# Patient Record
Sex: Female | Born: 1955 | Race: Black or African American | Hispanic: No | Marital: Single | Smoking: Never smoker
Health system: Southern US, Community
[De-identification: ages and names within clinical notes are randomized; demographics above are authoritative.]

## PROBLEM LIST (undated history)

## (undated) HISTORY — PX: GASTRIC BYPASS: SHX52

## (undated) HISTORY — PX: ABDOMINAL HYSTERECTOMY: SHX81

---

## 2020-09-04 ENCOUNTER — Encounter (HOSPITAL_BASED_OUTPATIENT_CLINIC_OR_DEPARTMENT_OTHER): Payer: Self-pay | Admitting: *Deleted

## 2020-09-04 ENCOUNTER — Emergency Department (HOSPITAL_BASED_OUTPATIENT_CLINIC_OR_DEPARTMENT_OTHER)
Admission: EM | Admit: 2020-09-04 | Discharge: 2020-09-04 | Disposition: A | Discharge status: Home / Self Care | Payer: Medicare Other | Attending: Emergency Medicine | Admitting: Emergency Medicine

## 2020-09-04 ENCOUNTER — Other Ambulatory Visit: Payer: Self-pay

## 2020-09-04 ENCOUNTER — Emergency Department (HOSPITAL_BASED_OUTPATIENT_CLINIC_OR_DEPARTMENT_OTHER): Payer: Medicare Other

## 2020-09-04 DIAGNOSIS — R000 Tachycardia, unspecified: Secondary | ICD-10-CM | POA: Insufficient documentation

## 2020-09-04 DIAGNOSIS — E041 Nontoxic single thyroid nodule: Secondary | ICD-10-CM | POA: Diagnosis not present

## 2020-09-04 DIAGNOSIS — R0989 Other specified symptoms and signs involving the circulatory and respiratory systems: Secondary | ICD-10-CM | POA: Insufficient documentation

## 2020-09-04 DIAGNOSIS — R0789 Other chest pain: Secondary | ICD-10-CM | POA: Insufficient documentation

## 2020-09-04 DIAGNOSIS — M25511 Pain in right shoulder: Secondary | ICD-10-CM | POA: Diagnosis not present

## 2020-09-04 DIAGNOSIS — Z20822 Contact with and (suspected) exposure to covid-19: Secondary | ICD-10-CM | POA: Diagnosis not present

## 2020-09-04 DIAGNOSIS — R937 Abnormal findings on diagnostic imaging of other parts of musculoskeletal system: Secondary | ICD-10-CM

## 2020-09-04 DIAGNOSIS — R072 Precordial pain: Secondary | ICD-10-CM | POA: Diagnosis present

## 2020-09-04 DIAGNOSIS — R918 Other nonspecific abnormal finding of lung field: Secondary | ICD-10-CM

## 2020-09-04 LAB — CBC
HCT: 40.7 % (ref 36.0–46.0)
Hemoglobin: 13.6 g/dL (ref 12.0–15.0)
MCH: 29.6 pg (ref 26.0–34.0)
MCHC: 33.4 g/dL (ref 30.0–36.0)
MCV: 88.7 fL (ref 80.0–100.0)
Platelets: 200 10*3/uL (ref 150–400)
RBC: 4.59 MIL/uL (ref 3.87–5.11)
RDW: 12.6 % (ref 11.5–15.5)
WBC: 13.1 10*3/uL — ABNORMAL HIGH (ref 4.0–10.5)
nRBC: 0 % (ref 0.0–0.2)

## 2020-09-04 LAB — BASIC METABOLIC PANEL
Anion gap: 12 (ref 5–15)
BUN: 9 mg/dL (ref 8–23)
CO2: 24 mmol/L (ref 22–32)
Calcium: 9.4 mg/dL (ref 8.9–10.3)
Chloride: 99 mmol/L (ref 98–111)
Creatinine, Ser: 0.57 mg/dL (ref 0.44–1.00)
GFR, Estimated: >60 mL/min (ref >60)
Glucose, Bld: 178 mg/dL — ABNORMAL HIGH (ref 70–99)
Potassium: 4.1 mmol/L (ref 3.5–5.1)
Sodium: 135 mmol/L (ref 135–145)

## 2020-09-04 LAB — HEPATIC FUNCTION PANEL
ALT: 69 U/L — ABNORMAL HIGH (ref 0–44)
AST: 107 U/L — ABNORMAL HIGH (ref 15–41)
Albumin: 4.1 g/dL (ref 3.5–5.0)
Alkaline Phosphatase: 103 U/L (ref 38–126)
Bilirubin, Direct: 0.2 mg/dL (ref 0.0–0.2)
Indirect Bilirubin: 0.1 mg/dL — ABNORMAL LOW (ref 0.3–0.9)
Total Bilirubin: 0.3 mg/dL (ref 0.3–1.2)
Total Protein: 8.3 g/dL — ABNORMAL HIGH (ref 6.5–8.1)

## 2020-09-04 LAB — LIPASE, BLOOD: Lipase: 19 U/L (ref 11–51)

## 2020-09-04 LAB — RESP PANEL BY RT-PCR (FLU A&B, COVID) ARPGX2
Influenza A by PCR: NEGATIVE
Influenza B by PCR: NEGATIVE
SARS Coronavirus 2 by RT PCR: NEGATIVE

## 2020-09-04 LAB — TROPONIN I (HIGH SENSITIVITY): Troponin I (High Sensitivity): 4 ng/L (ref <18)

## 2020-09-04 MED ORDER — DOXYCYCLINE HYCLATE 100 MG PO CAPS: 100.0000 mg | ORAL_CAPSULE | Freq: Two times a day (BID) | ORAL | 0 refills | Status: AC

## 2020-09-04 MED ORDER — IOHEXOL 350 MG/ML SOLN
100.0000 mL | Freq: Once | INTRAVENOUS | Status: AC | PRN
  Administered 2020-09-04: 100 mL via INTRAVENOUS

## 2020-09-04 MED ORDER — IBUPROFEN 100 MG/5ML PO SUSP
800.0000 mg | Freq: Once | ORAL | Status: AC
  Administered 2020-09-04: 800 mg via ORAL
  Filled 2020-09-04: qty 40

## 2020-09-04 NOTE — ED Notes (Signed)
Pt stating she wants to leave. This RN informed pt that we would like her to stay to receive CT results as if something is abnormal she would need to be admitted. Pt stating she doesn't understand why it is taking so long to get results and that this is a "rip off". This RN explained process and reasoning behind scans that were ordered. Requested to remove one of her IV's. Provider notified of patients desire to leave. Pt agrees to stay to receive results.

## 2020-09-04 NOTE — ED Notes (Signed)
Patient to ct scan.

## 2020-09-04 NOTE — ED Triage Notes (Signed)
Epigastric pain with right scapula pain x 5 days. Pain started after lifting. EKG done at triage.

## 2020-09-04 NOTE — Discharge Instructions (Addendum)
Your abnormal CT findings were discussed, recommend follow up with your doctor regarding the abnormal bone findings discussed. Thyroid nodule- follow up with PCP, may consider [place]. Ground glass opacities in lungs are often consistent with COVID. If your COVID test is positive, wear a mask for the remaining of 10 days.  If your COVID test is negative- a prescription for antibiotics can be sent to your pharmacy on file [facility] outpatient pharmacy) for atypical pneumonia.   Return as needed.

## 2020-09-04 NOTE — ED Provider Notes (Signed)
[facility] EMERGENCY DEPARTMENT Provider Note   CSN: [id number] Arrival date & time: 09/04/20  1303     History Chief Complaint  Patient presents with   Chest Pain    Angie Donaldson is a 65 y.o. female.  65 year old female presents with complaint of pain in her chest and deep to her right scapula.  States her pain started 5 to 6 days ago, she has been doing a lot of heavy lifting and moving her catheter box recently, does not typically do this sort of activity.  States that her pain in her midsternal area is burning to aching in nature, both pains improve with ibuprofen and return of symptoms the ibuprofen wears off.  Denies exertional pain, shortness of breath, diaphoresis, nausea.  Denies history of hypertension, hyperlipidemia, diabetes, is a non-smoker, no personal or family cardiac history, no personal or family history of PE or DVT.      History reviewed. No pertinent past medical history.  There are no problems to display for this patient.   Past Surgical History:  Procedure Laterality Date   ABDOMINAL HYSTERECTOMY     GASTRIC BYPASS       OB History   No obstetric history on file.     No family history on file.  Social History   Tobacco Use   Smoking status: Never   Smokeless tobacco: Never  Vaping Use   Vaping Use: Never used  Substance Use Topics   Alcohol use: Yes   Drug use: Never    Home Medications Prior to Admission medications   Not on File    Allergies    Sulfa antibiotics  Review of Systems   Review of Systems  Constitutional:  Negative for chills, diaphoresis and fever.  Respiratory:  Negative for cough and shortness of breath.   Cardiovascular:  Positive for chest pain. Negative for palpitations and leg swelling.  Gastrointestinal:  Negative for abdominal pain, constipation, diarrhea, nausea and vomiting.  Genitourinary:  Negative for dysuria.  Musculoskeletal:  Positive for back pain. Negative for arthralgias and  myalgias.  Skin:  Negative for rash and wound.  Allergic/Immunologic: Negative for immunocompromised state.  Neurological:  Negative for dizziness and weakness.  Hematological:  Negative for adenopathy. Does not bruise/bleed easily.  Psychiatric/Behavioral:  Negative for confusion.   All other systems reviewed and are negative.  Physical Exam Updated Vital Signs BP (!) 141/97   Pulse (!) 106   Temp 98.2 F (36.8 C) (Oral)   Resp 20   Ht 5\' 7"  (1.702 m)   Wt 57.6 kg   SpO2 100%   BMI 19.89 kg/m   Physical Exam Vitals and nursing note reviewed.  Constitutional:      General: She is not in acute distress.    Appearance: She is well-developed. She is not diaphoretic.  HENT:     Head: Normocephalic and atraumatic.  Cardiovascular:     Rate and Rhythm: Regular rhythm. Tachycardia present.     Heart sounds: Normal heart sounds.  Pulmonary:     Effort: Pulmonary effort is normal.     Breath sounds: Normal breath sounds.  Chest:     Chest wall: No tenderness.  Abdominal:     Palpations: Abdomen is soft.     Tenderness: There is no abdominal tenderness.  Musculoskeletal:     Right lower leg: No tenderness. No edema.     Left lower leg: No tenderness. No edema.  Skin:    General: Skin is warm and  dry.     Findings: No erythema or rash.  Neurological:     Mental Status: She is alert and oriented to person, place, and time.  Psychiatric:        Behavior: Behavior normal.    ED Results / Procedures / Treatments   Labs (all labs ordered are listed, but only abnormal results are displayed) Labs Reviewed  BASIC METABOLIC PANEL - Abnormal; Notable for the following components:      Result Value   Glucose, Bld 178 (*)    All other components within normal limits  CBC - Abnormal; Notable for the following components:   WBC 13.1 (*)    All other components within normal limits  RESP PANEL BY RT-PCR (FLU A&B, COVID) [id number]  HEPATIC FUNCTION PANEL  LIPASE, BLOOD  TROPONIN I  (HIGH SENSITIVITY)    EKG EKG Interpretation  Date/Time:  Thursday September 04 2020 13:16:34 EDT Ventricular Rate:  113 PR Interval:  148 QRS Duration: 74 QT Interval:  338 QTC Calculation: 463 R Axis:   75 Text Interpretation: Sinus tachycardia Cannot rule out Anterior infarct , age undetermined No previous tracing Confirmed by Gwyneth Sprout ([id number]) on 09/04/2020 1:56:05 PM  Radiology CT Angio Chest PE W/Cm &/Or Wo Cm  Result Date: 09/04/2020 CLINICAL DATA:  Epigastric pain, right scapular pain for 5 days, lifting injury EXAM: CT ANGIOGRAPHY CHEST WITH CONTRAST TECHNIQUE: Multidetector CT imaging of the chest was performed using the standard protocol during bolus administration of intravenous contrast. Multiplanar CT image reconstructions and MIPs were obtained to evaluate the vascular anatomy. CONTRAST:  OMNIPAQUE IOHEXOL 350 MG/ML SOLN COMPARISON:  09/04/2020 FINDINGS: Cardiovascular: This is a technically adequate evaluation of the pulmonary vasculature. No filling defects or pulmonary emboli. The heart is unremarkable without pericardial effusion. No evidence of thoracic aortic aneurysm or dissection. Minimal atherosclerosis. Mediastinum/Nodes: There is a 9 mm densely calcified nodule in the right lobe of the thyroid. Trachea and esophagus are unremarkable. No pathologic adenopathy. Lungs/Pleura: There is mild upper lobe predominant emphysema. Patchy nodular ground-glass airspace disease is seen within the right upper and left upper lobes, likely inflammatory or infectious. No dense consolidation, effusion, or pneumothorax. Central airways are patent. Upper Abdomen: Postsurgical changes are seen from previous bariatric surgery. Diffuse hepatic steatosis is identified. There is abnormal appearance of the visualized portions of the pancreatic body, with peripancreatic fat stranding and small subcentimeter lymph nodes. CT or MRI of the abdomen to include the pancreas is recommended for  complete characterization. Musculoskeletal: There are nonspecific sclerotic foci within the T12 vertebral body, T3 spinous process, and sternum. No acute displaced fractures. Reconstructed images demonstrate no additional findings. Review of the MIP images confirms the above findings. IMPRESSION: 1. No evidence of pulmonary embolus. 2. Patchy ground-glass opacities within the right upper and left upper lobes, consistent with inflammatory or infectious etiology. 3. Abnormal appearance of the visualized portions of the pancreatic body, with peripancreatic inflammatory changes. Pancreatitis could give this appearance, and correlation with laboratory values is recommended. CT or MRI of the pancreas could be performed to completely assess these findings. 4. Nonspecific sclerotic foci within the sternum and thoracic spine as above. If further evaluation is desired, nonemergent follow-up bone scan could be considered. 5. Aortic Atherosclerosis (ICD10-I70.0) and Emphysema (ICD10-J43.9). 6. Hepatic steatosis. Electronically Signed   By: Sharlet Salina M.D.   On: 09/04/2020 15:28   DG Chest Port 1 View  Result Date: 09/04/2020 CLINICAL DATA:  Chest pain EXAM: PORTABLE CHEST 1 VIEW COMPARISON:  None. FINDINGS: The heart size and mediastinal contours are within normal limits. Atherosclerotic calcification of the aortic knob. No focal airspace consolidation, pleural effusion, or pneumothorax. Degenerative changes of the bilateral shoulders. IMPRESSION: No active disease. Electronically Signed   By: Duanne Guess D.O.   On: 09/04/2020 14:16    Procedures Procedures   Medications Ordered in ED Medications  iohexol (OMNIPAQUE) 350 MG/ML injection 100 mL (100 mLs Intravenous Contrast Given 09/04/20 1450)  ibuprofen (ADVIL) 100 MG/5ML suspension 800 mg (800 mg Oral Given 09/04/20 1559)    ED Course  I have reviewed the triage vital signs and the nursing notes.  Pertinent labs & imaging results that were available  during my care of the patient were reviewed by me and considered in my medical decision making (see chart for details).  Clinical Course as of 09/04/20 1756  Thu Sep 04, 2020  1160 65 year old female presents with complaint of pain to her midsternal and right scapula for the past 6 days.  No associated shortness of breath, no exertional pain, pain resolved with ibuprofen. Patient is tachycardic, concern for PE, no ischemic changes on EKG, troponin 4 and this patient has had pain for the past 6 days.  CT angio chest with several incidental findings which were discussed with the patient, also discussed from last opacities to left upper and right upper lobes.  We will add on COVID test, if COVID test positive will finish out quarantine.  Otherwise if COVID is negative we will send antibiotics to patient's pharmacy.  Patient reports history of pancreatitis, does not feel similar to her prior pancreatitis flares.  She states that she would like to be discharged at this time.  Patient agrees to follow-up with her primary care provider regarding her CT findings today. [LM]    Clinical Course User Index [LM] Alden Hipp   MDM Rules/Calculators/A&P                           Final Clinical Impression(s) / ED Diagnoses Final diagnoses:  Atypical chest pain  Abnormal bone radiograph  Thyroid nodule  Ground glass opacity present on imaging of lung    Rx / DC Orders ED Discharge Orders     None        Jeannie Fend, PA-C 09/04/20 1756    Gwyneth Sprout, MD 09/05/20 1513

## 2022-06-07 IMAGING — CT CT ANGIO CHEST
2 of 8 series · 18 of 36 positions shown · IV contrast (Omnipaque)
Comparison: 09/04/2020

CLINICAL DATA: Epigastric pain, right scapular pain for 5 days,
lifting injury

EXAM:
CT ANGIOGRAPHY CHEST WITH CONTRAST
TECHNIQUE: Multidetector CT imaging of the chest was performed using the
standard protocol during bolus administration of intravenous
contrast. Multiplanar CT image reconstructions and MIPs were
obtained to evaluate the vascular anatomy.
CONTRAST:  100mL OMNIPAQUE IOHEXOL 350 MG/ML SOLN

[Series 6: pe coronal mpr · coronal · 0.56mm/px · 1 of 118 slices shown]
[im 59/118  mediastinal]
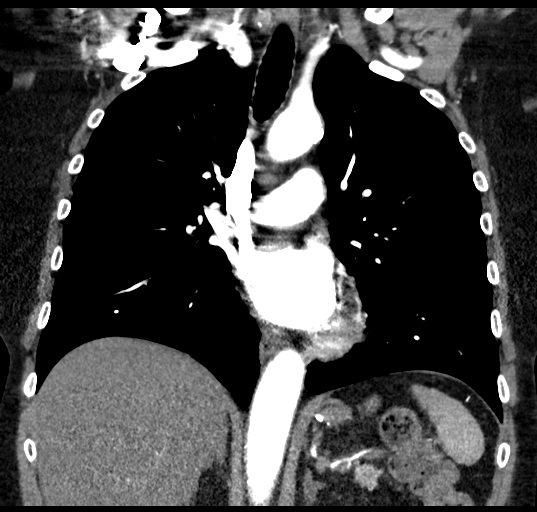

[Series 10: pe thins · axial · 0.64mm/px · z∈[-261,-11]mm · 17 of 280 slices shown]
[im 15/280  lung]
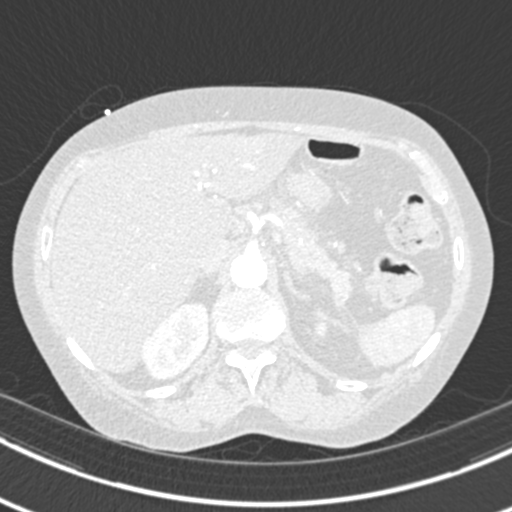
[im 30/280  mediastinal]
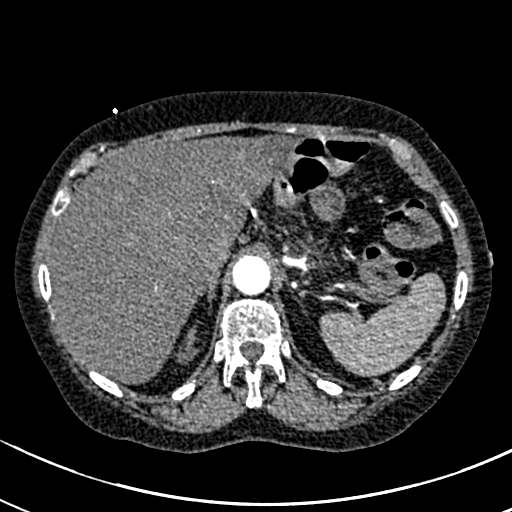
[im 45/280  lung]
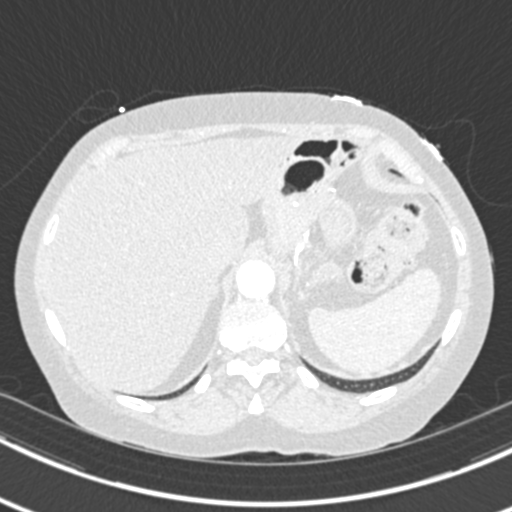
[im 59/280  mediastinal]
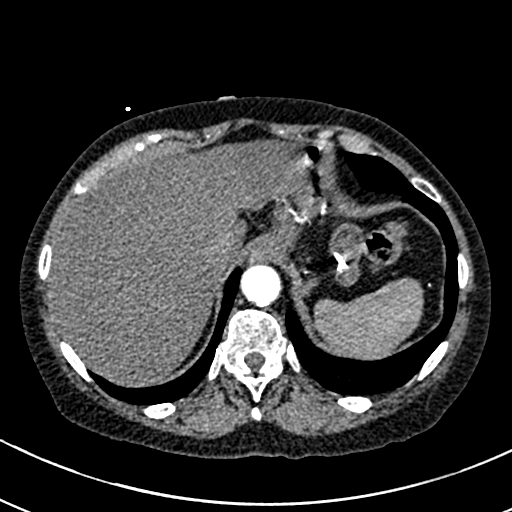
[im 74/280  lung]
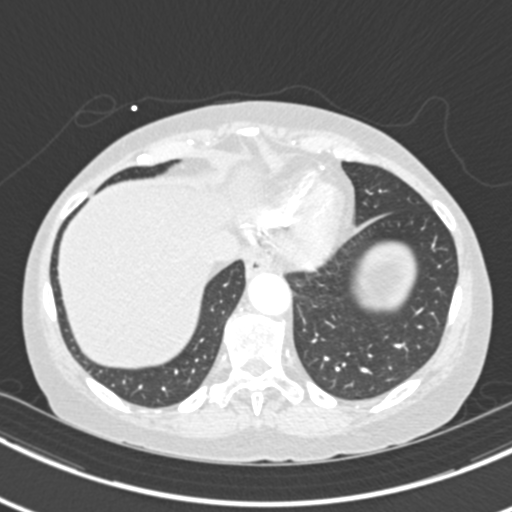
[im 89/280  mediastinal]
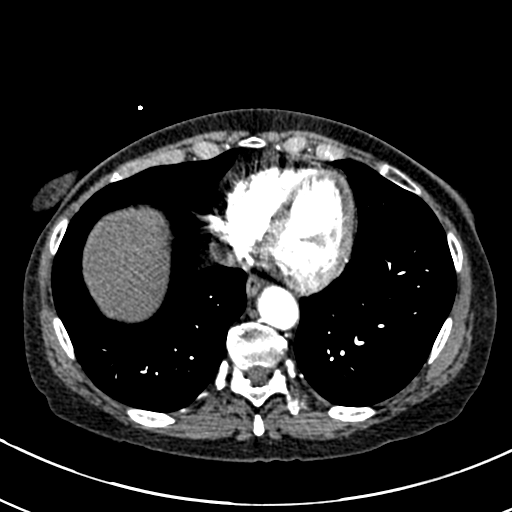
[im 103/280  lung]
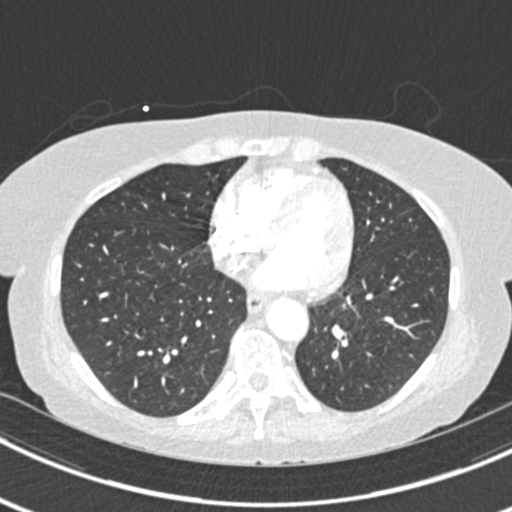
[im 118/280  mediastinal]
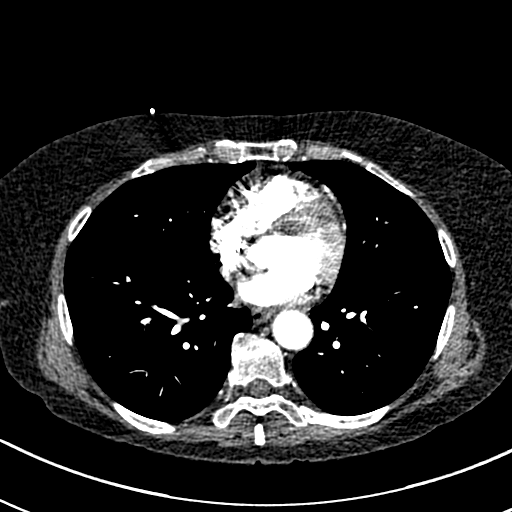
[im 147/280  lung]
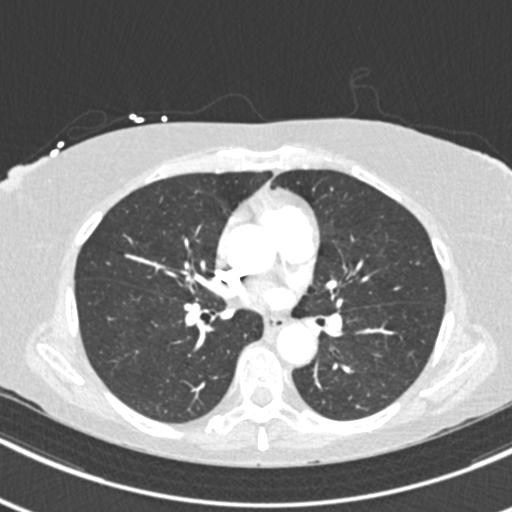
[im 162/280  mediastinal]
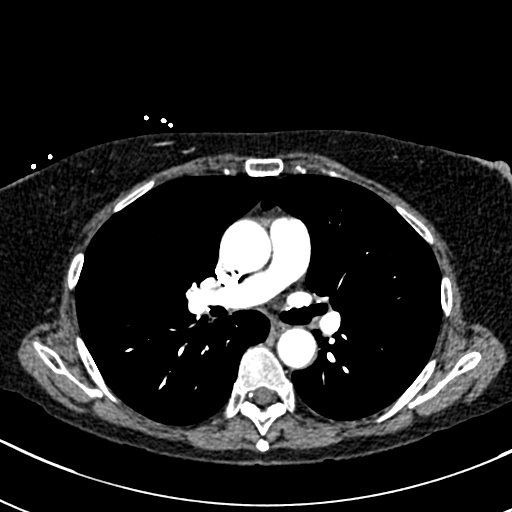
[im 177/280  lung]
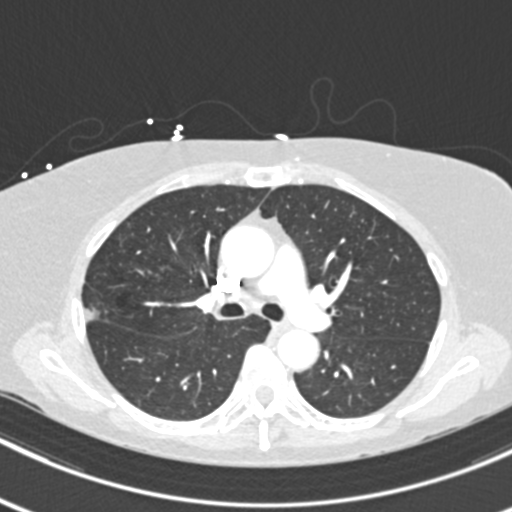
[im 191/280  mediastinal]
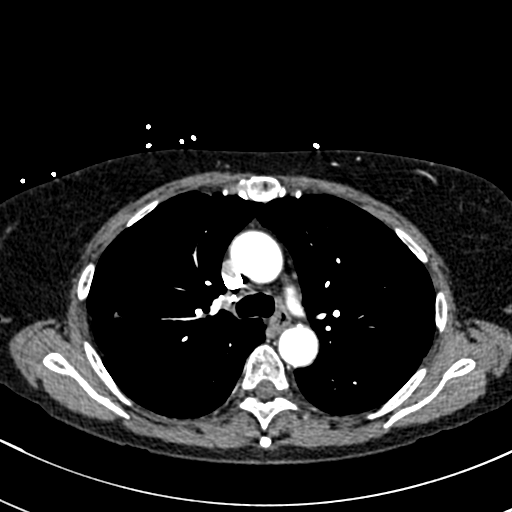
[im 206/280  lung]
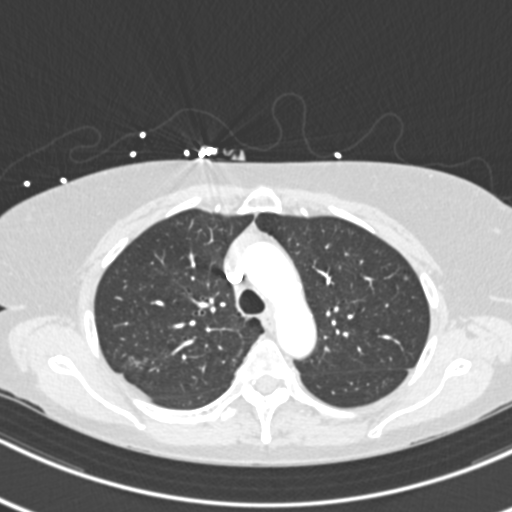
[im 221/280  mediastinal]
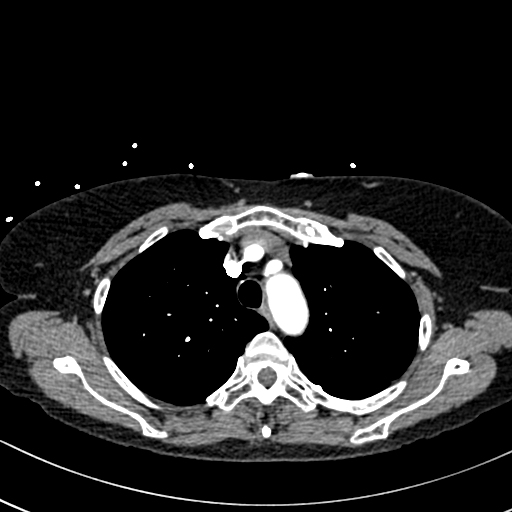
[im 235/280  lung]
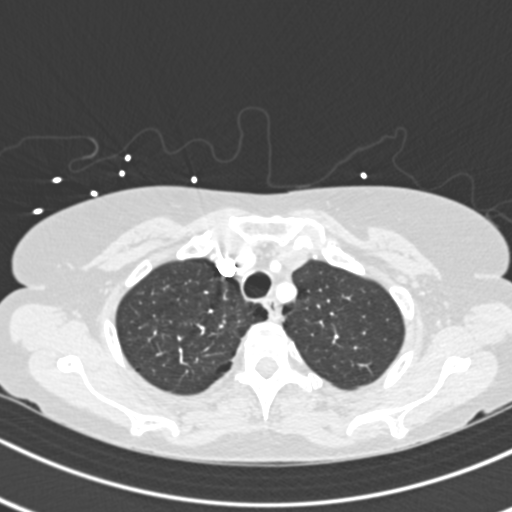
[im 250/280  mediastinal]
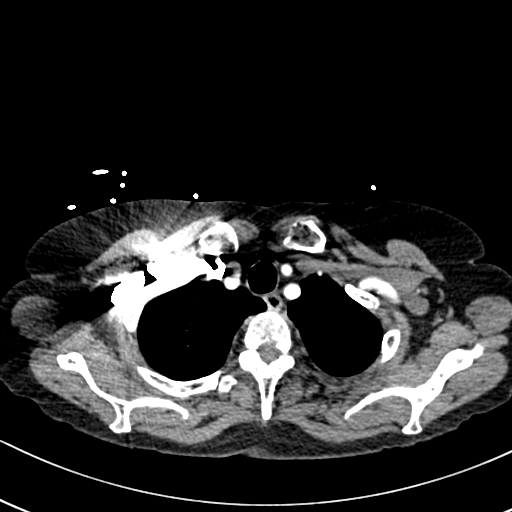
[im 265/280  lung]
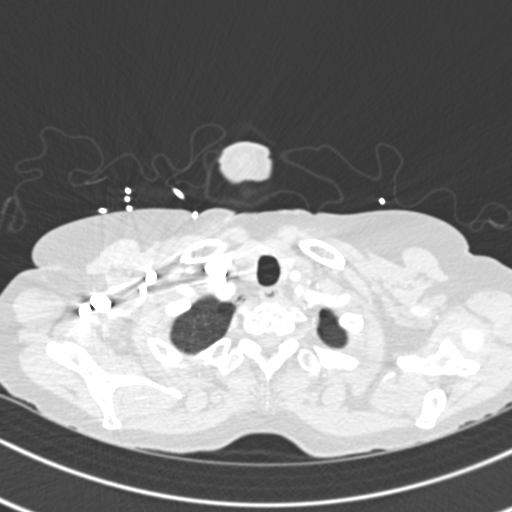

[18 of 36 positions shown; findings below may reference images not displayed]

FINDINGS: Cardiovascular: This is a technically adequate evaluation of the
pulmonary vasculature. No filling defects or pulmonary emboli.

The heart is unremarkable without pericardial effusion. No evidence
of thoracic aortic aneurysm or dissection. Minimal atherosclerosis.

Mediastinum/Nodes: There is a 9 mm densely calcified nodule in the
right lobe of the thyroid. Trachea and esophagus are unremarkable.
No pathologic adenopathy.

Lungs/Pleura: There is mild upper lobe predominant emphysema. Patchy
nodular ground-glass airspace disease is seen within the right upper
and left upper lobes, likely inflammatory or infectious. No dense
consolidation, effusion, or pneumothorax. Central airways are
patent.

Upper Abdomen: Postsurgical changes are seen from previous bariatric
surgery. Diffuse hepatic steatosis is identified.

There is abnormal appearance of the visualized portions of the
pancreatic body, with peripancreatic fat stranding and small
subcentimeter lymph nodes. CT or MRI of the abdomen to include the
pancreas is recommended for complete characterization.

Musculoskeletal: There are nonspecific sclerotic foci within the T12
vertebral body, T3 spinous process, and sternum. No acute displaced
fractures. Reconstructed images demonstrate no additional findings.

Review of the MIP images confirms the above findings.
IMPRESSION: 1. No evidence of pulmonary embolus.
2. Patchy ground-glass opacities within the right upper and left
upper lobes, consistent with inflammatory or infectious etiology.
3. Abnormal appearance of the visualized portions of the pancreatic
body, with peripancreatic inflammatory changes. Pancreatitis could
give this appearance, and correlation with laboratory values is
recommended. CT or MRI of the pancreas could be performed to
completely assess these findings.
4. Nonspecific sclerotic foci within the sternum and thoracic spine
as above. If further evaluation is desired, nonemergent follow-up
bone scan could be considered.
5. Aortic Atherosclerosis ([id number]-[id number]) and Emphysema ([id number]).
6. Hepatic steatosis.

## 2022-06-07 IMAGING — DX DG CHEST 1V PORT
1 series · 1 of 1 positions shown · non-contrast
Comparison: None.

CLINICAL DATA: Chest pain

EXAM:
PORTABLE CHEST 1 VIEW

[chest ap]
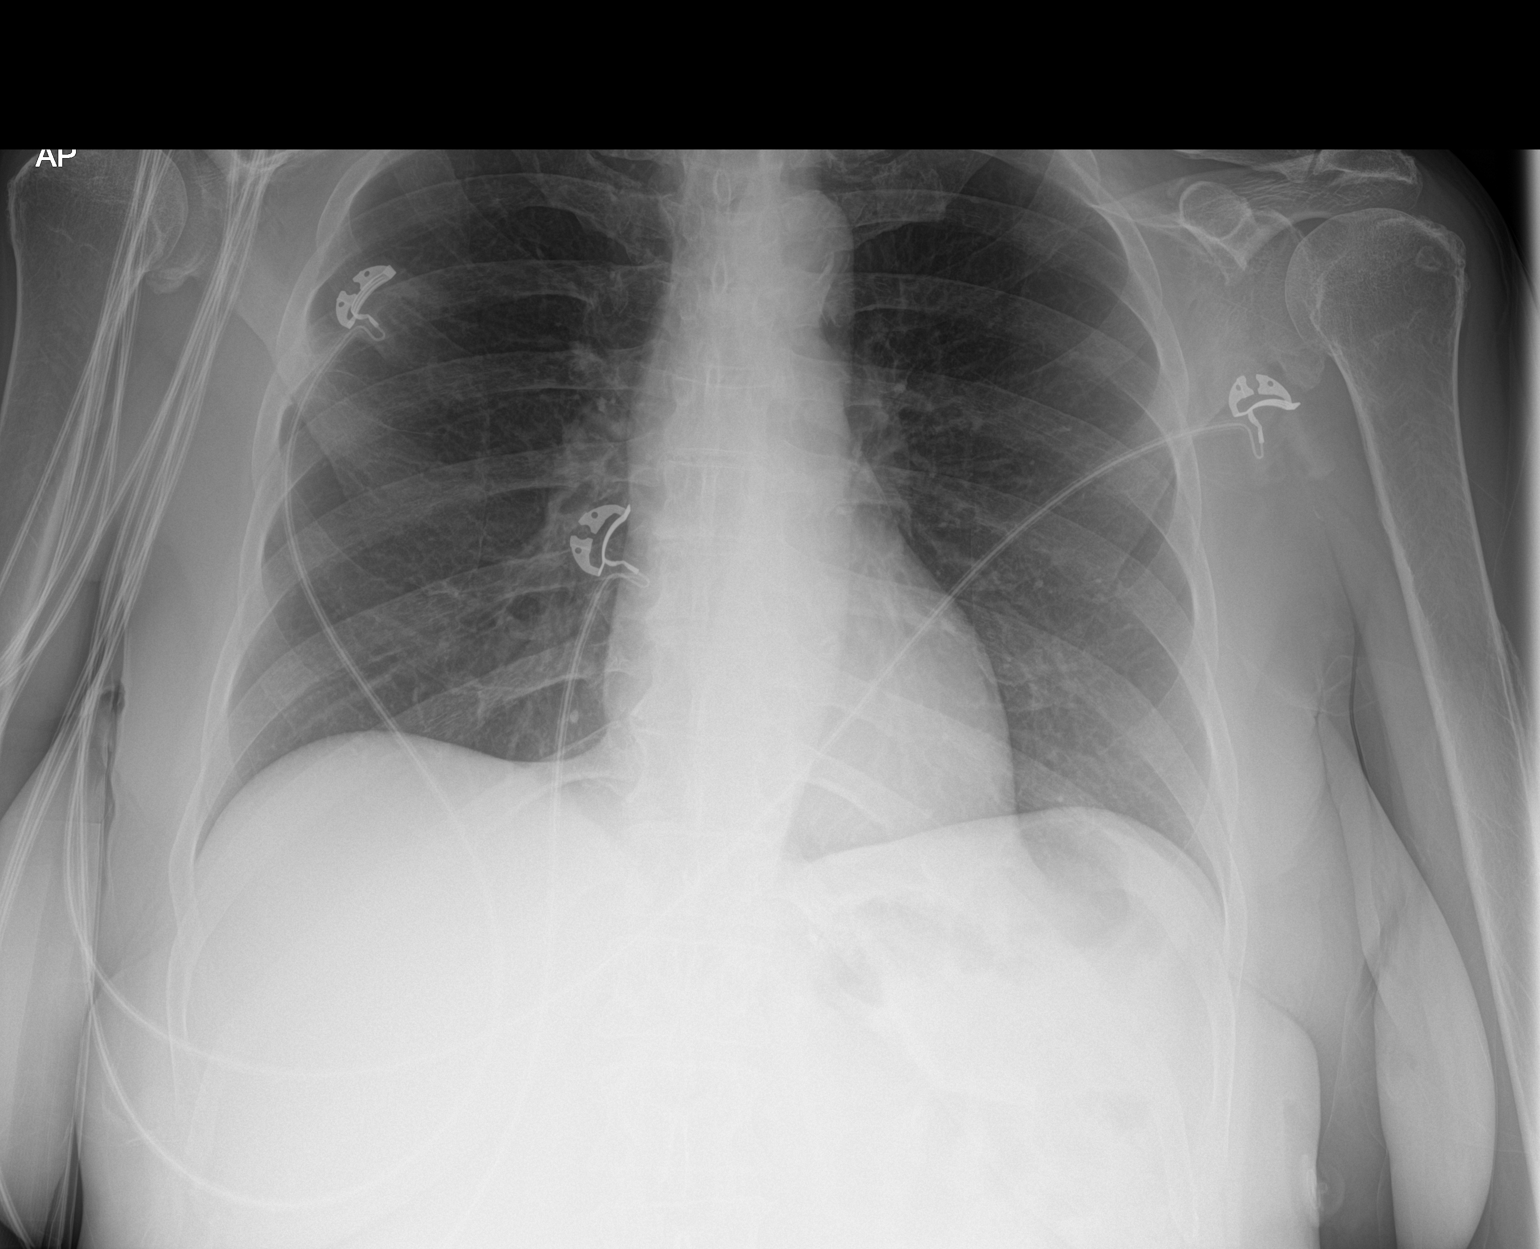

[1 of 1 positions shown; findings below may reference images not displayed]

FINDINGS: The heart size and mediastinal contours are within normal limits.
Atherosclerotic calcification of the aortic knob. No focal airspace
consolidation, pleural effusion, or pneumothorax. Degenerative
changes of the bilateral shoulders.
IMPRESSION: No active disease.
